# Patient Record
Sex: Female | Born: 1995
Health system: Southern US, Community
[De-identification: ages and names within clinical notes are randomized; demographics above are authoritative.]

---

## 2016-11-12 ENCOUNTER — Encounter (HOSPITAL_COMMUNITY): Payer: Self-pay

## 2016-11-12 ENCOUNTER — Emergency Department (HOSPITAL_COMMUNITY): Payer: No Typology Code available for payment source

## 2016-11-12 ENCOUNTER — Emergency Department (HOSPITAL_COMMUNITY)
Admission: EM | Admit: 2016-11-12 | Discharge: 2016-11-12 | Disposition: A | Discharge status: Home / Self Care | Payer: No Typology Code available for payment source | Attending: Emergency Medicine | Admitting: Emergency Medicine

## 2016-11-12 DIAGNOSIS — S20212A Contusion of left front wall of thorax, initial encounter: Secondary | ICD-10-CM | POA: Diagnosis not present

## 2016-11-12 DIAGNOSIS — Y92410 Unspecified street and highway as the place of occurrence of the external cause: Secondary | ICD-10-CM | POA: Diagnosis not present

## 2016-11-12 DIAGNOSIS — M79605 Pain in left leg: Secondary | ICD-10-CM | POA: Diagnosis not present

## 2016-11-12 DIAGNOSIS — M79641 Pain in right hand: Secondary | ICD-10-CM

## 2016-11-12 DIAGNOSIS — R1032 Left lower quadrant pain: Secondary | ICD-10-CM | POA: Insufficient documentation

## 2016-11-12 DIAGNOSIS — S298XXA Other specified injuries of thorax, initial encounter: Secondary | ICD-10-CM | POA: Diagnosis present

## 2016-11-12 DIAGNOSIS — M542 Cervicalgia: Secondary | ICD-10-CM | POA: Insufficient documentation

## 2016-11-12 DIAGNOSIS — S098XXA Other specified injuries of head, initial encounter: Secondary | ICD-10-CM | POA: Diagnosis not present

## 2016-11-12 DIAGNOSIS — Y939 Activity, unspecified: Secondary | ICD-10-CM | POA: Diagnosis not present

## 2016-11-12 DIAGNOSIS — Y999 Unspecified external cause status: Secondary | ICD-10-CM | POA: Diagnosis not present

## 2016-11-12 LAB — COMPREHENSIVE METABOLIC PANEL
ALBUMIN: 3.8 g/dL (ref 3.5–5.0)
ALK PHOS: 83 U/L (ref 38–126)
ALT: 98 U/L — ABNORMAL HIGH (ref 14–54)
ANION GAP: 9 (ref 5–15)
AST: 113 U/L — ABNORMAL HIGH (ref 15–41)
BILIRUBIN TOTAL: 0.2 mg/dL — AB (ref 0.3–1.2)
BUN: 8 mg/dL (ref 6–20)
CALCIUM: 8.5 mg/dL — AB (ref 8.9–10.3)
CO2: 22 mmol/L (ref 22–32)
Chloride: 107 mmol/L (ref 101–111)
Creatinine, Ser: 0.64 mg/dL (ref 0.44–1.00)
Glucose, Bld: 140 mg/dL — ABNORMAL HIGH (ref 65–99)
POTASSIUM: 3.5 mmol/L (ref 3.5–5.1)
Sodium: 138 mmol/L (ref 135–145)
TOTAL PROTEIN: 6.6 g/dL (ref 6.5–8.1)

## 2016-11-12 LAB — I-STAT CHEM 8, ED
BUN: 8 mg/dL (ref 6–20)
CALCIUM ION: 1.05 mmol/L — AB (ref 1.15–1.40)
CHLORIDE: 106 mmol/L (ref 101–111)
Creatinine, Ser: 0.5 mg/dL (ref 0.44–1.00)
GLUCOSE: 141 mg/dL — AB (ref 65–99)
HCT: 36 % (ref 36.0–46.0)
Hemoglobin: 12.2 g/dL (ref 12.0–15.0)
Potassium: 3.5 mmol/L (ref 3.5–5.1)
SODIUM: 141 mmol/L (ref 135–145)
TCO2: 24 mmol/L (ref 22–32)

## 2016-11-12 LAB — PROTIME-INR
INR: 1
PROTHROMBIN TIME: 13.2 s (ref 11.4–15.2)

## 2016-11-12 LAB — CBC
HEMATOCRIT: 35.7 % — AB (ref 36.0–46.0)
HEMOGLOBIN: 11.3 g/dL — AB (ref 12.0–15.0)
MCH: 25.7 pg — ABNORMAL LOW (ref 26.0–34.0)
MCHC: 31.7 g/dL (ref 30.0–36.0)
MCV: 81.3 fL (ref 78.0–100.0)
Platelets: 193 10*3/uL (ref 150–400)
RBC: 4.39 MIL/uL (ref 3.87–5.11)
RDW: 13.9 % (ref 11.5–15.5)
WBC: 11.1 10*3/uL — AB (ref 4.0–10.5)

## 2016-11-12 LAB — I-STAT BETA HCG BLOOD, ED (MC, WL, AP ONLY): I-stat hCG, quantitative: <5 m[IU]/mL (ref <5)

## 2016-11-12 LAB — ETHANOL

## 2016-11-12 MED ORDER — SODIUM CHLORIDE 0.9 % IV BOLUS (SEPSIS)
1000.0000 mL | Freq: Once | INTRAVENOUS | Status: AC
  Administered 2016-11-12: 1000 mL via INTRAVENOUS

## 2016-11-12 MED ORDER — ONDANSETRON HCL 4 MG/2ML IJ SOLN
4.0000 mg | Freq: Once | INTRAMUSCULAR | Status: AC
  Administered 2016-11-12: 4 mg via INTRAVENOUS
  Filled 2016-11-12: qty 2

## 2016-11-12 MED ORDER — ONDANSETRON HCL 4 MG/2ML IJ SOLN
4.0000 mg | Freq: Once | INTRAMUSCULAR | Status: AC
  Administered 2016-11-12: 4 mg via INTRAVENOUS

## 2016-11-12 MED ORDER — SODIUM CHLORIDE 0.9 % IV SOLN
INTRAVENOUS | Status: DC
  Administered 2016-11-12: 17:00:00 via INTRAVENOUS

## 2016-11-12 MED ORDER — HYDROMORPHONE HCL 1 MG/ML IJ SOLN
1.0000 mg | Freq: Once | INTRAMUSCULAR | Status: AC
  Administered 2016-11-12: 1 mg via INTRAVENOUS
  Filled 2016-11-12: qty 1

## 2016-11-12 MED ORDER — ACETAMINOPHEN 500 MG PO TABS: 1000.0000 mg | ORAL_TABLET | Freq: Three times a day (TID) | ORAL | 0 refills | Status: AC

## 2016-11-12 MED ORDER — IOPAMIDOL (ISOVUE-300) INJECTION 61%
INTRAVENOUS | Status: AC
  Administered 2016-11-12: 100 mL
  Filled 2016-11-12: qty 100

## 2016-11-12 MED ORDER — MORPHINE SULFATE (PF) 4 MG/ML IV SOLN
4.0000 mg | Freq: Once | INTRAVENOUS | Status: AC
  Administered 2016-11-12: 4 mg via INTRAVENOUS
  Filled 2016-11-12: qty 1

## 2016-11-12 NOTE — ED Notes (Signed)
Back from xray
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
## 2016-11-12 NOTE — ED Notes (Signed)
Pt returned from CT °
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
## 2020-01-19 ENCOUNTER — Ambulatory Visit: Payer: Self-pay

## 2020-01-19 ENCOUNTER — Other Ambulatory Visit: Payer: Self-pay

## 2020-01-19 LAB — POCT URINE DRUG SCREEN
POC Amphetamine UR: NOT DETECTED
POC Cocaine UR: NOT DETECTED
POC Methamphetamine UR: NOT DETECTED
POC Opiate Ur: NOT DETECTED
POC PHENCYCLIDINE UR: NOT DETECTED
URINE TEMPERATURE: 92 Degrees F (ref 90.0–100.0)

## 2021-03-02 ENCOUNTER — Ambulatory Visit
Admission: EM | Admit: 2021-03-02 | Discharge: 2021-03-02 | Disposition: A | Discharge status: Home / Self Care | Payer: BC Managed Care – PPO | Attending: Family Medicine | Admitting: Family Medicine

## 2021-03-02 ENCOUNTER — Encounter: Payer: Self-pay | Admitting: Emergency Medicine

## 2021-03-02 DIAGNOSIS — Z1152 Encounter for screening for COVID-19: Secondary | ICD-10-CM | POA: Diagnosis not present

## 2021-03-02 DIAGNOSIS — J208 Acute bronchitis due to other specified organisms: Secondary | ICD-10-CM | POA: Diagnosis not present

## 2021-03-02 DIAGNOSIS — B349 Viral infection, unspecified: Secondary | ICD-10-CM | POA: Diagnosis not present

## 2021-03-02 MED ORDER — PREDNISONE 20 MG PO TABS: 20.0000 mg | ORAL_TABLET | Freq: Every day | ORAL | 0 refills | Status: AC

## 2021-03-02 MED ORDER — ALBUTEROL SULFATE HFA 108 (90 BASE) MCG/ACT IN AERS: 2.0000 | INHALATION_SPRAY | Freq: Four times a day (QID) | RESPIRATORY_TRACT | 0 refills | Status: AC | PRN

## 2021-03-02 MED ORDER — ALBUTEROL SULFATE HFA 108 (90 BASE) MCG/ACT IN AERS: 2.0000 | INHALATION_SPRAY | Freq: Four times a day (QID) | RESPIRATORY_TRACT | Status: DC

## 2021-03-02 MED ORDER — PROMETHAZINE-DM 6.25-15 MG/5ML PO SYRP: 5.0000 mL | ORAL_SOLUTION | Freq: Four times a day (QID) | ORAL | 0 refills | Status: AC | PRN

## 2021-03-02 NOTE — ED Triage Notes (Signed)
Pt presents with cough, ST, runny nose, and HA x 2 weeks

## 2021-03-02 NOTE — Discharge Instructions (Signed)
Your viral screening test will result within 3 days. As long as you do not have fever you do not need to quarantine while awaiting on your test results.  I am treating you for acute viral bronchitis.  Take medication as prescribed. You will use albuterol inhaler 2 puffs every 4-6 hours as needed for shortness of breath and/or wheezing.  If any of you develop difficulty breathing or chest tightness go immediately to the emergency department

## 2021-03-02 NOTE — ED Provider Notes (Signed)
Renaldo Fiddler    CSN: [id number] Arrival date & time: 03/02/21  1202      History   Chief Complaint Chief Complaint  Patient presents with   Cough   Sore Throat   Nasal Congestion   Headache    HPI Suzanne Martin is a 25 y.o. female.   HPI Patient presents today with 1 week of cough, sore throat, runny nose, headache and body aches.  She denies fever.  She endorses generalized body aches.  No nausea, vomiting or diarrhea.  No known sick contacts.  She does have a persistent cough.  She denies any recent history of asthma although had asthma as a child.  She endorses use of an albuterol inhaler in the past.  She denies any wheezing or active chest tightness.   History reviewed. No pertinent past medical history.  There are no problems to display for this patient.   History reviewed. No pertinent surgical history.  OB History   No obstetric history on file.      Home Medications    Prior to Admission medications   Medication Sig Start Date End Date Taking? Authorizing Provider  albuterol (VENTOLIN HFA) 108 (90 Base) MCG/ACT inhaler Inhale 2 puffs into the lungs every 6 (six) hours as needed for wheezing or shortness of breath. 03/02/21  Yes Bing Neighbors, FNP  predniSONE (DELTASONE) 20 MG tablet Take 1 tablet (20 mg total) by mouth daily with breakfast for 5 days. 03/02/21 03/07/21 Yes Bing Neighbors, FNP  promethazine-dextromethorphan (PROMETHAZINE-DM) 6.25-15 MG/5ML syrup Take 5 mLs by mouth 4 (four) times daily as needed for cough. 03/02/21  Yes Bing Neighbors, FNP  metFORMIN (GLUCOPHAGE-XR) 500 MG 24 hr tablet Take 500 mg by mouth 2 (two) times daily. 11/04/20   [provider]  NIKKI 3-0.02 MG tablet Take 1 tablet by mouth daily. 11/04/20   [provider]  OZEMPIC, 0.25 OR 0.5 MG/DOSE, 2 MG/1.5ML SOPN Inject into the skin. 11/04/20   [provider]  pioglitazone (ACTOS) 15 MG tablet Take 15 mg by mouth daily.  10/15/20   [provider]    Family History No family history on file.  Social History Social History   Tobacco Use   Smoking status: Never   Smokeless tobacco: Never  Vaping Use   Vaping Use: Never used  Substance Use Topics   Alcohol use: No   Drug use: No     Allergies   Patient has no known allergies.   Review of Systems Review of Systems Pertinent negatives listed in HPI  Physical Exam Triage Vital Signs ED Triage Vitals [03/02/21 1227]  Enc Vitals Group     BP      Pulse      Resp      Temp      Temp src      SpO2      Weight      Height      Head Circumference      Peak Flow      Pain Score 0     Pain Loc      Pain Edu?      Excl. in GC?    No data found.  Updated Vital Signs BP 114/72 (BP Location: Left Arm)    Pulse 71    Temp 98.9 F (37.2 C) (Oral)    Resp 18    SpO2 98%   Visual Acuity Right Eye Distance:   Left Eye  Distance:   Bilateral Distance:    Right Eye Near:   Left Eye Near:    Bilateral Near:     Physical Exam General appearance: Alert, acutely ill-appearing, no distress Head: Normocephalic, without obvious abnormality, atraumatic ENT: Normocephalic, nares with mucosal edema, congestion,  oropharynx w/o exudate Respiratory: Respirations even , unlabored, coarse lung sound, no rales or wheeze Heart: Rate and rhythm normal. No gallop or murmurs noted on exam  Extremities: No gross deformities Skin: Skin color, texture, turgor normal. No rashes seen  Psych: Appropriate mood and affect. Neurologic: No obvious focal neurological abnormalities present on exam   UC Treatments / Results  Labs (all labs ordered are listed, but only abnormal results are displayed) Labs Reviewed  COVID-19, FLU A+B NAA    EKG   Radiology No results found.  Procedures Procedures (including critical care time)  Medications Ordered in UC Medications  albuterol (VENTOLIN HFA) 108 (90 Base) MCG/ACT inhaler 2 puff (has no  administration in time range)    Initial Impression / Assessment and Plan / UC Course  I have reviewed the triage vital signs and the nursing notes.  Pertinent labs & imaging results that were available during my care of the patient were reviewed by me and considered in my medical decision making (see chart for details).    Viral illness with a secondary viral bronchitis COVID/flu pending. Treatment today with rescue inhaler as needed every 4-6 hours, Promethazine DM for cough, and prednisone 20 mg once daily for 5 days. Red flag precautions discussed which warrant immediate evaluation in setting of the ER. Follow-up here as needed. Final Clinical Impressions(s) / UC Diagnoses   Final diagnoses:  Viral illness  Encounter for screening for COVID-19  Viral bronchitis     Discharge Instructions      Your viral screening test will result within 3 days. As long as you do not have fever you do not need to quarantine while awaiting on your test results.  I am treating you for acute viral bronchitis.  Take medication as prescribed. You will use albuterol inhaler 2 puffs every 4-6 hours as needed for shortness of breath and/or wheezing.  If any of you develop difficulty breathing or chest tightness go immediately to the emergency department      ED Prescriptions     Medication Sig Dispense Auth. Provider   predniSONE (DELTASONE) 20 MG tablet Take 1 tablet (20 mg total) by mouth daily with breakfast for 5 days. 5 tablet Bing Neighbors, FNP   promethazine-dextromethorphan (PROMETHAZINE-DM) 6.25-15 MG/5ML syrup Take 5 mLs by mouth 4 (four) times daily as needed for cough. 180 mL Bing Neighbors, FNP   albuterol (VENTOLIN HFA) 108 (90 Base) MCG/ACT inhaler Inhale 2 puffs into the lungs every 6 (six) hours as needed for wheezing or shortness of breath. 1 each Bing Neighbors, FNP      PDMP not reviewed this encounter.   Bing Neighbors, FNP 03/02/21 1257

## 2021-03-03 LAB — COVID-19, FLU A+B NAA
Influenza A, NAA: NOT DETECTED
Influenza B, NAA: NOT DETECTED
SARS-CoV-2, NAA: NOT DETECTED
